# Patient Record
Sex: Female | Born: 1995 | Race: Black or African American | Hispanic: No | Marital: Single | Smoking: Never smoker
Health system: Southern US, Community
[De-identification: ages and names within clinical notes are randomized; demographics above are authoritative.]

## PROBLEM LIST (undated history)

## (undated) DIAGNOSIS — I890 Lymphedema, not elsewhere classified: Secondary | ICD-10-CM

## (undated) DIAGNOSIS — R569 Unspecified convulsions: Secondary | ICD-10-CM

---
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
## 2015-08-12 ENCOUNTER — Emergency Department: Payer: No Typology Code available for payment source

## 2015-08-12 ENCOUNTER — Encounter: Payer: Self-pay | Admitting: Emergency Medicine

## 2015-08-12 ENCOUNTER — Emergency Department
Admission: EM | Admit: 2015-08-12 | Discharge: 2015-08-12 | Disposition: A | Discharge status: Home / Self Care | Payer: No Typology Code available for payment source | Attending: Emergency Medicine | Admitting: Emergency Medicine

## 2015-08-12 DIAGNOSIS — Z79899 Other long term (current) drug therapy: Secondary | ICD-10-CM | POA: Diagnosis not present

## 2015-08-12 DIAGNOSIS — Z8669 Personal history of other diseases of the nervous system and sense organs: Secondary | ICD-10-CM | POA: Diagnosis not present

## 2015-08-12 DIAGNOSIS — R519 Headache, unspecified: Secondary | ICD-10-CM

## 2015-08-12 DIAGNOSIS — R51 Headache: Secondary | ICD-10-CM | POA: Insufficient documentation

## 2015-08-12 MED ORDER — BUTALBITAL-APAP-CAFFEINE 50-325-40 MG PO TABS: 1.0000 | ORAL_TABLET | Freq: Four times a day (QID) | ORAL | Status: AC | PRN
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
## 2015-08-26 ENCOUNTER — Emergency Department
Admission: EM | Admit: 2015-08-26 | Discharge: 2015-08-26 | Disposition: A | Discharge status: Home / Self Care | Payer: Self-pay | Attending: Emergency Medicine | Admitting: Emergency Medicine

## 2015-08-26 ENCOUNTER — Encounter: Payer: Self-pay | Admitting: Emergency Medicine

## 2015-08-26 DIAGNOSIS — R569 Unspecified convulsions: Secondary | ICD-10-CM | POA: Insufficient documentation

## 2015-08-26 LAB — POCT PREGNANCY, URINE: Preg Test, Ur: NEGATIVE
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
## 2015-08-26 NOTE — Discharge Instructions (Signed)
Seizure, Adult A seizure is abnormal electrical activity in the brain. Seizures usually last from 30 seconds to 2 minutes. There are various types of seizures. Before a seizure, you may have a warning sensation (aura) that a seizure is about to occur. An aura may include the following symptoms:   Fear or anxiety.  Nausea.  Feeling like the room is spinning (vertigo).  Vision changes, such as seeing flashing lights or spots. Common symptoms during a seizure include:  A change in attention or behavior (altered mental status).  Convulsions with rhythmic jerking movements.  Drooling.  Rapid eye movements.  Grunting.  Loss of bladder and bowel control.  Bitter taste in the mouth.  Tongue biting. After a seizure, you may feel confused and sleepy. You may also have an injury resulting from convulsions during the seizure. HOME CARE INSTRUCTIONS   If you are given medicines, take them exactly as prescribed by your health care provider.  Keep all follow-up appointments as directed by your health care provider.  Do not swim or drive or engage in risky activity during which a seizure could cause further injury to you or others until your health care provider says it is OK.  Get adequate rest.  Teach friends and family what to do if you have a seizure. They should:  Lay you on the ground to prevent a fall.  Put a cushion under your head.  Loosen any tight clothing around your neck.  Turn you on your side. If vomiting occurs, this helps keep your airway clear.  Stay with you until you recover.  Know whether or not you need emergency care. SEEK IMMEDIATE MEDICAL CARE IF:  The seizure lasts longer than 5 minutes.  The seizure is severe or you do not wake up immediately after the seizure.  You have an altered mental status after the seizure.  You are having more frequent or worsening seizures. Someone should drive you to the emergency department or call local emergency  services (911 in [place] MAKE SURE YOU:  Understand these instructions.  Will watch your condition.  Will get help right away if you are not doing well or get worse.   This information is not intended to replace advice given to you by your health care provider. Make sure you discuss any questions you have with your health care provider.   Document Released: 04/01/2000 Document Revised: 04/25/2014 Document Reviewed: 11/14/2012 Elsevier Interactive Patient Education 2016 [employer]  Epilepsy Epilepsy is a disorder in which a person has repeated seizures over time. A seizure is a release of abnormal electrical activity in the brain. Seizures can cause a change in attention, behavior, or the ability to remain awake and alert (altered mental status). Seizures often involve uncontrollable shaking (convulsions).  Most people with epilepsy lead normal lives. However, people with epilepsy are at an increased risk of falls, accidents, and injuries. Therefore, it is important to begin treatment right away. CAUSES  Epilepsy has many possible causes. Anything that disturbs the normal pattern of brain cell activity can lead to seizures. This may include:   Head injury.  Birth trauma.  High fever as a child.  Stroke.  Bleeding into or around the brain.  Certain drugs.  Prolonged low oxygen, such as what occurs after CPR efforts.  Abnormal brain development.  Certain illnesses, such as meningitis, encephalitis (brain infection), malaria, and other infections.  An imbalance of nerve signaling chemicals (neurotransmitters).  SIGNS AND SYMPTOMS  The symptoms of a seizure can  vary greatly from one person to another. Right before a seizure, you may have a warning (aura) that a seizure is about to occur. An aura may include the following symptoms:  Fear or anxiety.  Nausea.  Feeling like the room is spinning (vertigo).  Vision changes, such as seeing flashing lights or spots. Common  symptoms during a seizure include:  Abnormal sensations, such as an abnormal smell or a bitter taste in the mouth.   Sudden, general body stiffness.   Convulsions that involve rhythmic jerking of the face, arm, or leg on one or both sides.   Sudden change in consciousness.   Appearing to be awake but not responding.   Appearing to be asleep but cannot be awakened.   Grimacing, chewing, lip smacking, drooling, tongue biting, or loss of bowel or bladder control. After a seizure, you may feel sleepy for a while. DIAGNOSIS  Your health care provider will ask about your symptoms and take a medical history. Descriptions from any witnesses to your seizures will be very helpful in the diagnosis. A physical exam, including a detailed neurological exam, is necessary. Various tests may be done, such as:   An electroencephalogram (EEG). This is a painless test of your brain waves. In this test, a diagram is created of your brain waves. These diagrams can be interpreted by a specialist.  An MRI of the brain.   A CT scan of the brain.   A spinal tap (lumbar puncture, LP).  Blood tests to check for signs of infection or abnormal blood chemistry. TREATMENT  There is no cure for epilepsy, but it is generally treatable. Once epilepsy is diagnosed, it is important to begin treatment as soon as possible. For most people with epilepsy, seizures can be controlled with medicines. The following may also be used:  A pacemaker for the brain (vagus nerve stimulator) can be used for people with seizures that are not well controlled by medicine.  Surgery on the brain. For some people, epilepsy eventually goes away. HOME CARE INSTRUCTIONS   Follow your health care provider's recommendations on driving and safety in normal activities.  Get enough rest. Lack of sleep can cause seizures.  Only take over-the-counter or prescription medicines as directed by your health care provider. Take any  prescribed medicine exactly as directed.  Avoid any known triggers of your seizures.  Keep a seizure diary. Record what you recall about any seizure, especially any possible trigger.   Make sure the people you live and work with know that you are prone to seizures. They should receive instructions on how to help you. In general, a witness to a seizure should:   Cushion your head and body.   Turn you on your side.   Avoid unnecessarily restraining you.   Not place anything inside your mouth.   Call for emergency medical help if there is any question about what has occurred.   Follow up with your health care provider as directed. You may need regular blood tests to monitor the levels of your medicine.  SEEK MEDICAL CARE IF:   You develop signs of infection or other illness. This might increase the risk of a seizure.   You seem to be having more frequent seizures.   Your seizure pattern is changing.  SEEK IMMEDIATE MEDICAL CARE IF:   You have a seizure that does not stop after a few moments.   You have a seizure that causes any difficulty in breathing.   You have a  seizure that results in a very severe headache.   You have a seizure that leaves you with the inability to speak or use a part of your body.    This information is not intended to replace advice given to you by your health care provider. Make sure you discuss any questions you have with your health care provider.   Document Released: 04/04/2005 Document Revised: 01/23/2013 Document Reviewed: 11/14/2012 Elsevier Interactive Patient Education [employer] Elsevier Inc.

## 2015-08-26 NOTE — ED Notes (Signed)
Pt had witnessed seizure that lasted approx 45 sec per EMS. EMS picked pt up from laundry mat. Pt is A&O. Pt hx of seizures and denies any noncompliance. Pt states sometimes her meds are not at the same time though. Pt states her last seizure was in March. VS stable

## 2015-08-26 NOTE — ED Provider Notes (Signed)
[facility] Emergency Department Provider Note  ____________________________________________  Time seen: 6:30 PM  I have reviewed the triage vital signs and the nursing notes.   HISTORY  Chief Complaint Seizures    HPI Diana Richards is a 20 y.o. female comes to the ED due to a seizure today. This was a witnessed seizure in the [place]. The patient has a history of epilepsy, recent admission to [facility] in February or March for increased seizure frequency at that time. She did undergo MRI and EEG according to the electronic medical record although I'm unable to find the specific results of those procedures. Her twice a day Keppra was increased to 1500 mg twice a day. No other medications at this time other than Fioricet which she takes for chronic headaches. The headaches are unchanged. No new trauma or illness. She is otherwise in her usual state of health, no drug use, eating and drinking normally. She feels like she is sleeping well as well. Denies excessive stimulant use.     Past Medical History  Diagnosis Date  . Seizures (HCC)   . Lymphedema      There are no active problems to display for this patient.    History reviewed. No pertinent past surgical history.   Current Outpatient Rx  Name  Route  Sig  Dispense  Refill  . butalbital-acetaminophen-caffeine (FIORICET) 50-325-40 MG tablet   Oral   Take 1-2 tablets by mouth every 6 (six) hours as needed for headache.   20 tablet   0   . levETIRAcetam (KEPPRA) 500 MG tablet   Oral   Take 1,500 mg by mouth 2 (two) times daily.            Allergies Review of patient's allergies indicates no known allergies.   No family history on file.  Social History Social History  Substance Use Topics  . Smoking status: Never Smoker   . Smokeless tobacco: None  . Alcohol Use: No    Review of Systems  Constitutional:   No fever or chills.  Eyes:   No vision changes.  ENT:   No sore throat. No  rhinorrhea. Cardiovascular:   No chest pain. Respiratory:   No dyspnea or cough. Gastrointestinal:   Negative for abdominal pain, vomiting and diarrhea.  Genitourinary:   Negative for dysuria or difficulty urinating. Musculoskeletal:   Negative for focal pain or swelling Neurological:  Positive for chronic bilateral frontal headache 10-point ROS otherwise negative.  ____________________________________________   PHYSICAL EXAM:  VITAL SIGNS: ED Triage Vitals  Enc Vitals Group     BP 08/26/15 1724 141/77 mmHg     Pulse Rate 08/26/15 1724 99     Resp 08/26/15 1724 16     Temp 08/26/15 1724 98.5 F (36.9 C)     Temp Source 08/26/15 1724 Oral     SpO2 08/26/15 1724 99 %     Weight --      Height --      Head Cir --      Peak Flow --      Pain Score 08/26/15 1726 10     Pain Loc --      Pain Edu? --      Excl. in GC? --     Vital signs reviewed, nursing assessments reviewed.   Constitutional:   Alert and oriented. Well appearing and in no distress. Eyes:   No scleral icterus. No conjunctival pallor. PERRL. EOMI.  No nystagmus. ENT   Head:  Normocephalic and atraumatic.   Nose:   No congestion/rhinnorhea. No septal hematoma   Mouth/Throat:   MMM, no pharyngeal erythema. No peritonsillar mass. Abrasion to left lateral side of the tongue   Neck:   No stridor. No SubQ emphysema. No meningismus. No neck pain, full range of motion Hematological/Lymphatic/Immunilogical:   No cervical lymphadenopathy. Cardiovascular:   RRR. Symmetric bilateral radial and DP pulses.  No murmurs.  Respiratory:   Normal respiratory effort without tachypnea nor retractions. Breath sounds are clear and equal bilaterally. No wheezes/rales/rhonchi. Gastrointestinal:   Soft and nontender. Non distended. There is no CVA tenderness.  No rebound, rigidity, or guarding. Genitourinary:   deferred Musculoskeletal:   Nontender with normal range of motion in all extremities. No joint effusions.  No  lower extremity tenderness.  No edema. Neurologic:   Normal speech and language.  CN 2-10 normal. Motor grossly intact. No gross focal neurologic deficits are appreciated.  Skin:    Skin is warm, dry and intact. No rash noted.  No petechiae, purpura, or bullae.  ____________________________________________    LABS (pertinent positives/negatives) (all labs ordered are listed, but only abnormal results are displayed) Labs Reviewed  POCT PREGNANCY, URINE  CBG MONITORING, ED   ____________________________________________   EKG  Interpreted by me Sinus rhythm rate of 96, normal axis intervals QRS ST segments and T waves.  ____________________________________________    RADIOLOGY    ____________________________________________   PROCEDURES   ____________________________________________   INITIAL IMPRESSION / ASSESSMENT AND PLAN / ED COURSE  Pertinent labs & imaging results that were available during my care of the patient were reviewed by me and considered in my medical decision making (see chart for details).  Patient well appearing no acute distress. Had a brief seizure episode today which was witnessed, exam is consistent with likely recent seizure.Unable to pinpoint an exact cause of the patient states that she does take her Keppra at varying times of the day and so she may have just been subtherapeutic relatively. Encouraged her to be rigorous about taking her medicines at the same time every morning and night, follow closely with primary care and neurology for continued monitoring of her symptoms. Return precautions for any increased seizure frequency. During period of observation here in the emergency department, the patient has not had any recurrent symptoms and is asymptomatic. Normal vital signs.     ____________________________________________   FINAL CLINICAL IMPRESSION(S) / ED DIAGNOSES  Final diagnoses:  Seizure (HCC)       Portions of this note  were generated with dragon dictation software. Dictation errors may occur despite best attempts at proofreading.   Sharman CheekPhillip Keana Dueitt, MD 08/26/15 1944

## 2015-09-24 ENCOUNTER — Ambulatory Visit: Payer: Medicaid Other

## 2015-09-27 ENCOUNTER — Encounter: Payer: Self-pay | Admitting: Emergency Medicine

## 2015-09-27 ENCOUNTER — Emergency Department: Payer: Medicaid Other

## 2015-09-27 ENCOUNTER — Emergency Department
Admission: EM | Admit: 2015-09-27 | Discharge: 2015-09-28 | Disposition: A | Discharge status: Home / Self Care | Payer: Medicaid Other | Attending: Emergency Medicine | Admitting: Emergency Medicine

## 2015-09-27 DIAGNOSIS — G40909 Epilepsy, unspecified, not intractable, without status epilepticus: Secondary | ICD-10-CM | POA: Insufficient documentation

## 2015-09-27 DIAGNOSIS — R569 Unspecified convulsions: Secondary | ICD-10-CM | POA: Diagnosis present

## 2015-09-27 LAB — COMPREHENSIVE METABOLIC PANEL
ALBUMIN: 4.3 g/dL (ref 3.5–5.0)
ALK PHOS: 65 U/L (ref 38–126)
ALT: 11 U/L — ABNORMAL LOW (ref 14–54)
AST: 28 U/L (ref 15–41)
Anion gap: 13 (ref 5–15)
BILIRUBIN TOTAL: 0.3 mg/dL (ref 0.3–1.2)
BUN: 9 mg/dL (ref 6–20)
CALCIUM: 9.4 mg/dL (ref 8.9–10.3)
CO2: 18 mmol/L — ABNORMAL LOW (ref 22–32)
Chloride: 106 mmol/L (ref 101–111)
Creatinine, Ser: 0.81 mg/dL (ref 0.44–1.00)
GFR calc Af Amer: >60 mL/min (ref >60)
GFR calc non Af Amer: >60 mL/min (ref >60)
GLUCOSE: 96 mg/dL (ref 65–99)
Potassium: 3.7 mmol/L (ref 3.5–5.1)
Sodium: 137 mmol/L (ref 135–145)
TOTAL PROTEIN: 7.8 g/dL (ref 6.5–8.1)

## 2015-09-27 LAB — CBC
HEMATOCRIT: 41.3 % (ref 35.0–47.0)
Hemoglobin: 13.2 g/dL (ref 12.0–16.0)
MCH: 28.1 pg (ref 26.0–34.0)
MCHC: 32 g/dL (ref 32.0–36.0)
MCV: 87.8 fL (ref 80.0–100.0)
Platelets: 252 10*3/uL (ref 150–440)
RBC: 4.71 MIL/uL (ref 3.80–5.20)
RDW: 12.9 % (ref 11.5–14.5)
WBC: 9.2 10*3/uL (ref 3.6–11.0)

## 2015-09-27 MED ORDER — SODIUM CHLORIDE 0.9 % IV SOLN
500.0000 mg | Freq: Once | INTRAVENOUS | Status: AC
  Administered 2015-09-27: 500 mg via INTRAVENOUS
  Filled 2015-09-27: qty 5

## 2015-09-27 MED ORDER — LEVETIRACETAM 1000 MG PO TABS: 1000.0000 mg | ORAL_TABLET | Freq: Two times a day (BID) | ORAL | Status: AC

## 2015-09-27 NOTE — ED Notes (Signed)
Patient transported to CT 

## 2015-09-27 NOTE — Discharge Instructions (Signed)

## 2015-09-27 NOTE — ED Provider Notes (Signed)
[facility] Emergency Department Provider Note  ____________________________________________    I have reviewed the triage vital signs and the nursing notes.   HISTORY  Chief Complaint Seizures    HPI Diana Richards is a 20 y.o. female who presents after reported seizure. Per EMS patient was at [facility] and was found down. She was found to be confused and likely postictal. Patient has a history of seizures and apparently takes Keppra. She continues to be confused which I do think is postictal state. History is limited because of this. Review of records shows that she sees [facility] neurology and she is on 1500 g of Keppra twice a day.     Past Medical History  Diagnosis Date  . Seizures (HCC)   . Lymphedema     There are no active problems to display for this patient.   History reviewed. No pertinent past surgical history.  Current Outpatient Rx  Name  Route  Sig  Dispense  Refill  . butalbital-acetaminophen-caffeine (FIORICET) 50-325-40 MG tablet   Oral   Take 1-2 tablets by mouth every 6 (six) hours as needed for headache.   20 tablet   0   . levETIRAcetam (KEPPRA) 500 MG tablet   Oral   Take 1,500 mg by mouth 2 (two) times daily.           Allergies Review of patient's allergies indicates no known allergies.  History reviewed. No pertinent family history.  Social History Social History  Substance Use Topics  . Smoking status: Never Smoker   . Smokeless tobacco: None  . Alcohol Use: No    Review of Systems Performed after patient had improved  Constitutional: Negative for fever. Eyes: Negative for redness ENT: Negative for sore throat Cardiovascular: Negative for chest pain Respiratory: Negative for shortness of breath. Gastrointestinal: Negative for abdominal pain Genitourinary: Negative for dysuria. Musculoskeletal: Negative for back pain. Skin: Negative for rash. Neurological: Negative for focal weakness Psychiatric: no  anxiety    ____________________________________________   PHYSICAL EXAM:  VITAL SIGNS: ED Triage Vitals  Enc Vitals Group     BP 09/27/15 2115 127/61 mmHg     Pulse Rate 09/27/15 2115 110     Resp 09/27/15 2115 19     Temp 09/27/15 2115 98.6 F (37 C)     Temp Source 09/27/15 2115 Oral     SpO2 09/27/15 2115 98 %     Weight 09/27/15 2115 150 lb (68.04 kg)     Height 09/27/15 2115  (1.676 m)     Head Cir --      Peak Flow --      Pain Score --      Pain Loc --      Pain Edu? --      Excl. in GC? --      Constitutional: Slow to respond but in no acute distress Eyes: Conjunctivae are normal. No erythema or injection ENT   Head: Normocephalic and atraumatic.   Mouth/Throat: Mucous membranes are moist. Cardiovascular: Normal rate, regular rhythm. Normal and symmetric distal pulses are present in the upper extremities. Respiratory: Normal respiratory effort without tachypnea nor retractions. Breath sounds are clear and equal bilaterally.  Gastrointestinal: Soft and non-tender in all quadrants. No distention. There is no CVA tenderness. Genitourinary: deferred Musculoskeletal: Nontender with normal range of motion in all extremities. No lower extremity tenderness nor edema. Neurologic:  Normal speech and language. No gross focal neurologic deficits are appreciated. Skin:  Skin is warm,  dry and intact. No rash noted. Psychiatric: Mood and affect are normal. Patient exhibits appropriate insight and judgment.  ____________________________________________    LABS (pertinent positives/negatives)  Labs Reviewed  COMPREHENSIVE METABOLIC PANEL - Abnormal; Notable for the following:    CO2 18 (*)    ALT 11 (*)    All other components within normal limits  CBC    ____________________________________________   EKG  None  ____________________________________________    RADIOLOGY  CT head  unremarkable  ____________________________________________   PROCEDURES  Procedure(s) performed: none  Critical Care performed: none  ____________________________________________   INITIAL IMPRESSION / ASSESSMENT AND PLAN / ED COURSE  Pertinent labs & imaging results that were available during my care of the patient were reviewed by me and considered in my medical decision making (see chart for details).  Discussed with Dr[facility] Regino [facility] of [facility]. He recommends increasing Keppra to 2000 mg twice a day and he will discuss with the patient's primary neurologist for close follow-up. We will observe the patient in the ED and if no more seizure activity she will be discharged with above prescription.  ____________________________________________   FINAL CLINICAL IMPRESSION(S) / ED DIAGNOSES  Final diagnoses:  Seizure (HCC)          Jene Everyobert Vianny Schraeder, MD 09/27/15 2312

## 2015-09-27 NOTE — ED Notes (Signed)
Pharm called for med 

## 2015-09-27 NOTE — ED Notes (Signed)
Patient presents to Emergency Department via EMS with complaints of [facility] [employer] and fall (unwitnessed) from a standing position.  Friends found pt on floor.    Pt reports hx of seizures  Since MArch 2016, pt reports taking Keppra bid and reports compliant.    Lip swollen and small amount of blood at right temple

## 2015-10-06 ENCOUNTER — Ambulatory Visit: Payer: Medicaid Other

## 2015-10-13 ENCOUNTER — Ambulatory Visit: Payer: Medicaid Other

## 2016-02-19 IMAGING — MR MR HEAD WO/W CM
12 of 13 series · 40 of 48 positions shown · IV contrast (13ml Multihance)
Comparison: CT head 09/22/2014

CLINICAL DATA: Seizure

EXAM:
MRI HEAD WITHOUT AND WITH CONTRAST
TECHNIQUE: Multiplanar, multiecho pulse sequences of the brain and surrounding
structures were obtained without and with intravenous contrast.
CONTRAST:  13mL MULTIHANCE GADOBENATE DIMEGLUMINE 529 MG/ML IV SOLN

[Series 2: T1 · sagittal · 5.0mm · 0.45mm/px · 1 of 25 slices shown]
[im 1/25]
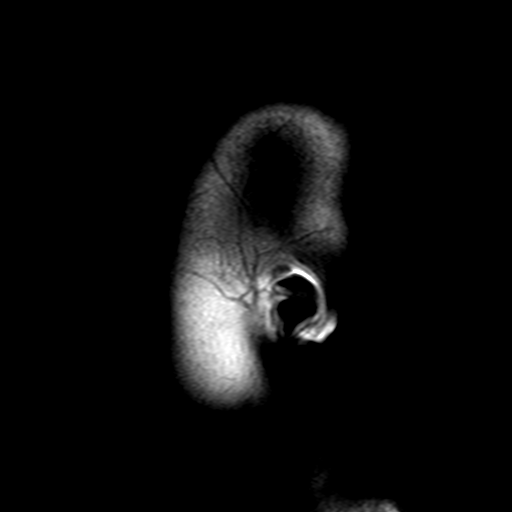

[Series 4: DWI · axial · 4.0mm · 0.94mm/px · z∈[-82,+81]mm · 4 of 43 slices shown (1 of 4)]
[im 1/43]
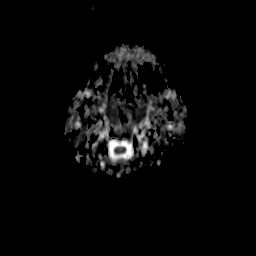
[im 15/43]
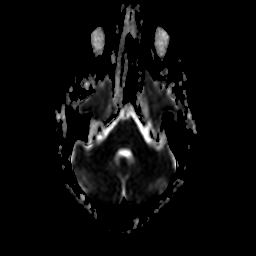
[im 29/43]
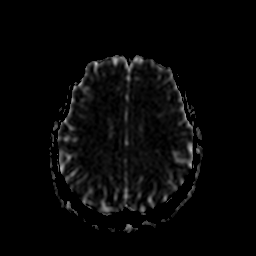
[im 43/43]
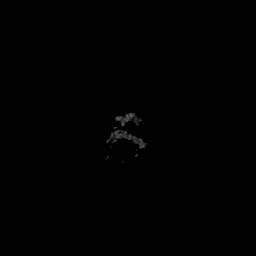

[Series 6: DWI · coronal · 5.0mm · 1.80mm/px · 4 of 37 slices shown (2 of 4)]
[im 1/37]
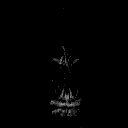
[im 13/37]
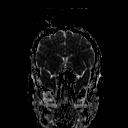
[im 25/37]
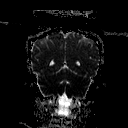
[im 37/37]
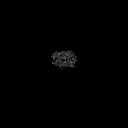

[Series 7: DWI · axial · 4.0mm · 0.94mm/px · z∈[-82,+89]mm · 8 of 87 slices shown (3 of 4)]
[im 1/87]
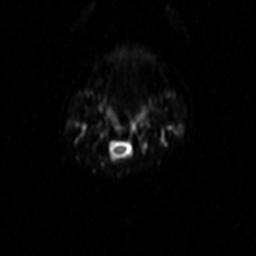
[im 13/87]
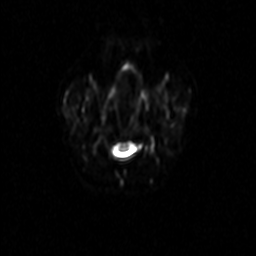
[im 25/87]
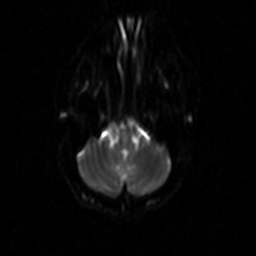
[im 37/87]
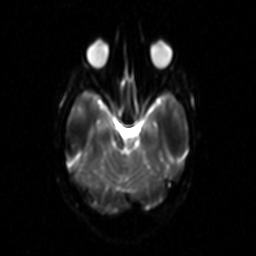
[im 50/87]
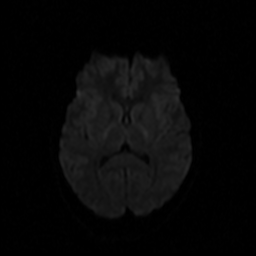
[im 62/87]
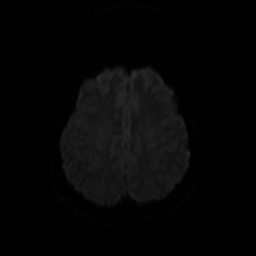
[im 74/87]
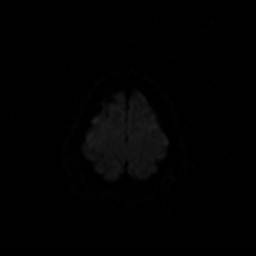
[im 87/87]
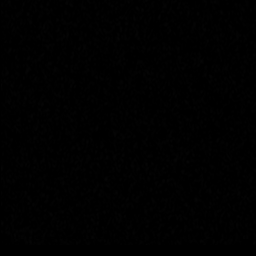

[Series 8: DWI · coronal · 5.0mm · 1.80mm/px · 3 of 36 slices shown (4 of 4)]
[im 1/36]
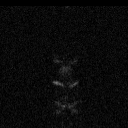
[im 18/36]
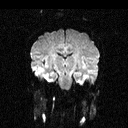
[im 36/36]
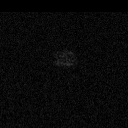

[Series 9: T2 · axial · 5.0mm · 0.45mm/px · z∈[-74,+84]mm · 2 of 26 slices shown (1 of 4)]
[im 1/26]
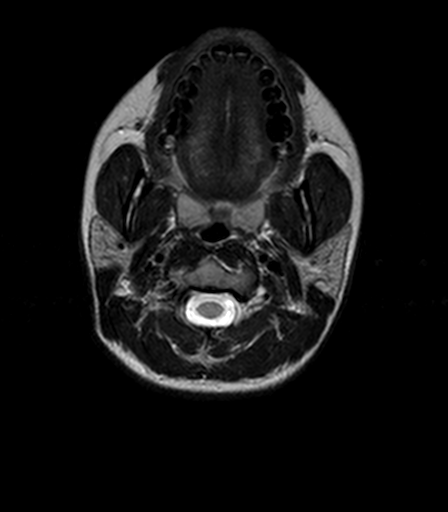
[im 26/26]
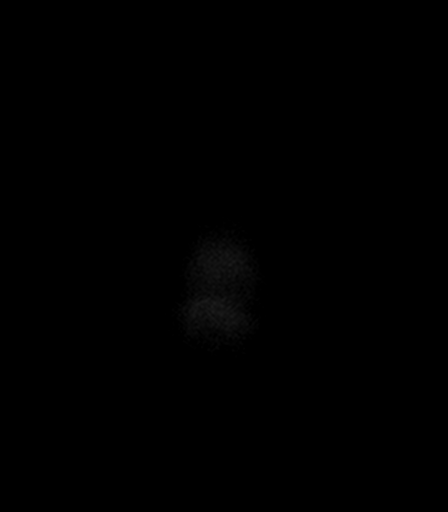

[Series 10: FLAIR · axial · 5.0mm · 0.90mm/px · z∈[-74,+84]mm · 2 of 26 slices shown]
[im 1/26]
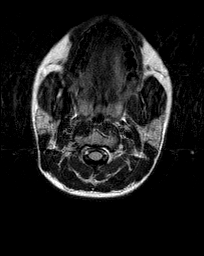
[im 26/26]
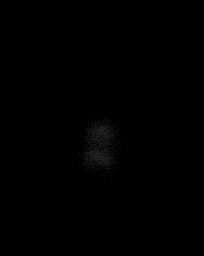

[Series 11: T2 · axial · 5.0mm · 0.45mm/px · z∈[-74,+84]mm · 2 of 26 slices shown (2 of 4)]
[im 1/26]
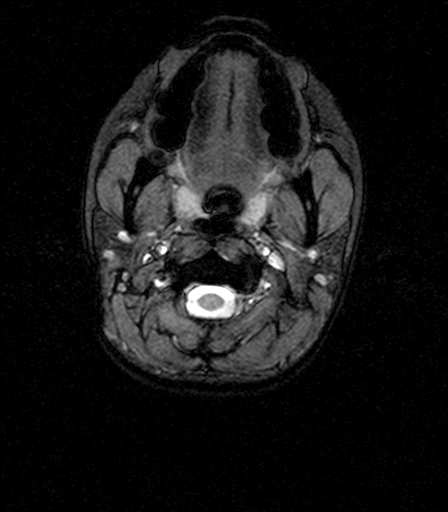
[im 26/26]
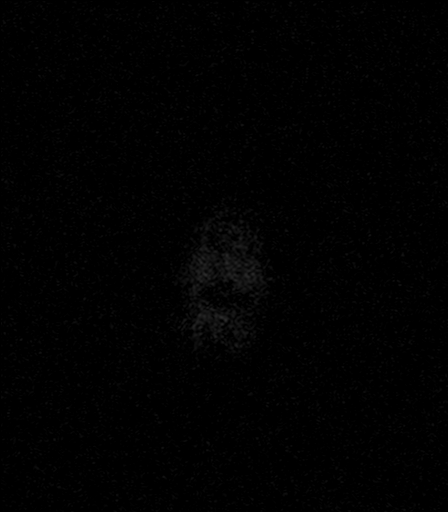

[Series 13: T2 · oblique · 3.0mm · 0.70mm/px · 2 of 21 slices shown (3 of 4)]
[im 1/21]
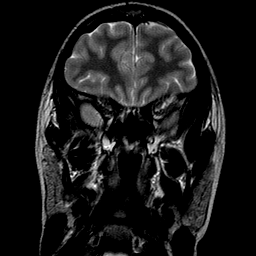
[im 21/21]
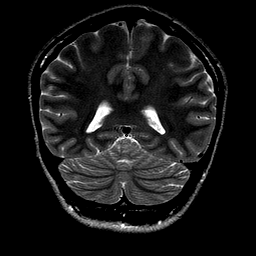

[Series 14: T2 · coronal · 5.0mm · 0.45mm/px · 3 of 29 slices shown (4 of 4)]
[im 1/29]
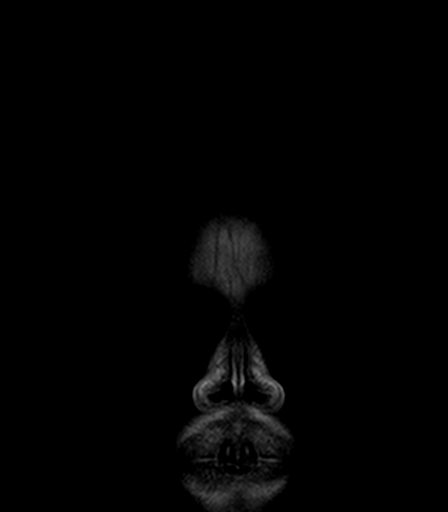
[im 15/29]
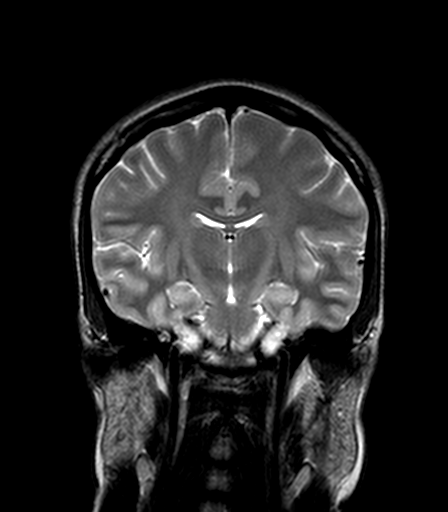
[im 29/29]
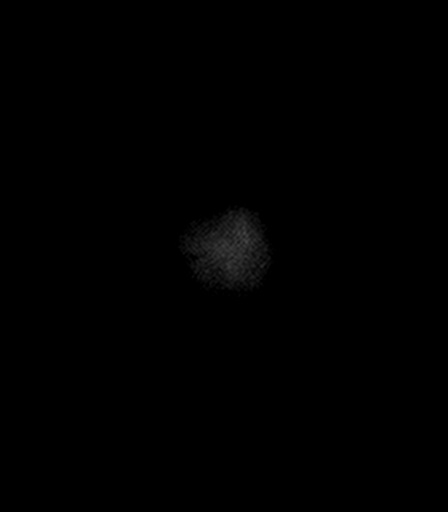

[Series 15: T1 post-contrast · axial · 3.0mm · 0.45mm/px · z∈[-81,+91]mm · 6 of 60 slices shown (1 of 2)]
[im 1/60]
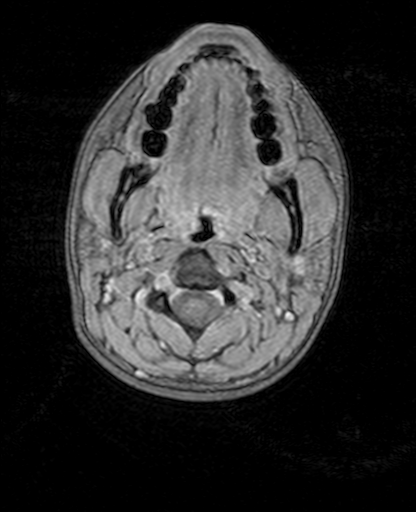
[im 12/60]
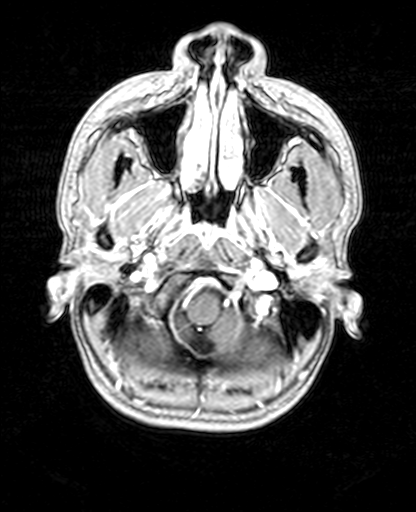
[im 24/60]
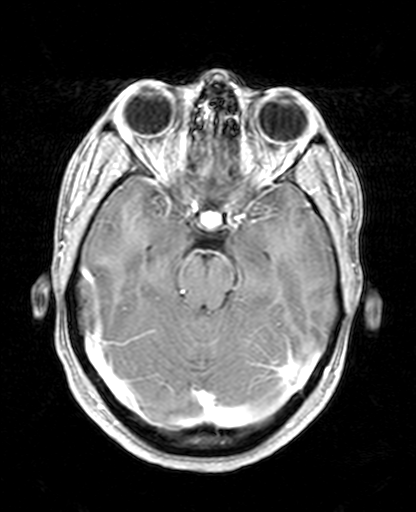
[im 36/60]
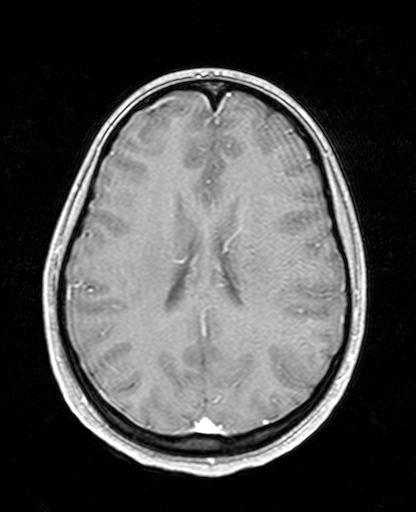
[im 48/60]
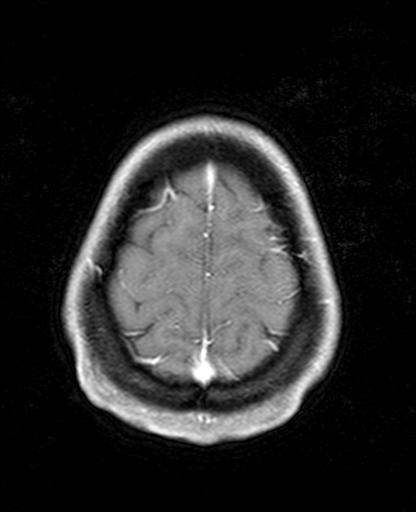
[im 60/60]
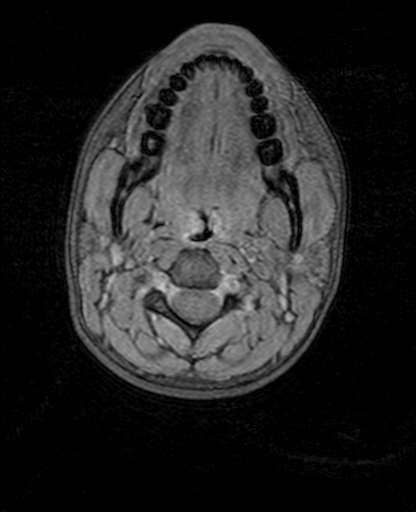

[Series 16: T1 post-contrast · coronal · 5.0mm · 0.45mm/px · 3 of 29 slices shown (2 of 2)]
[im 1/29]
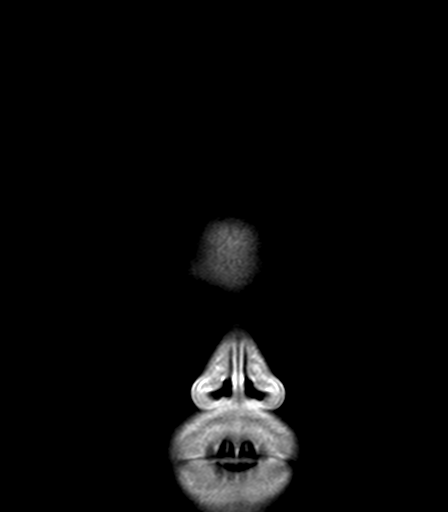
[im 15/29]
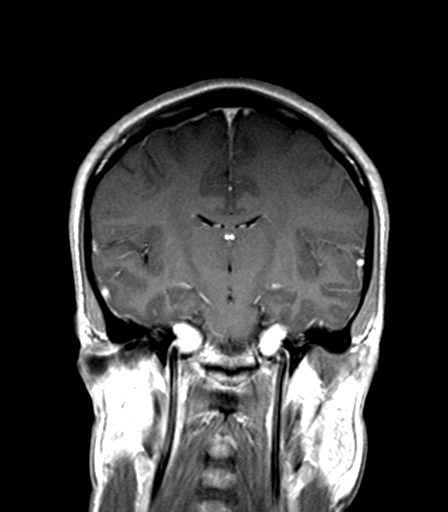
[im 29/29]
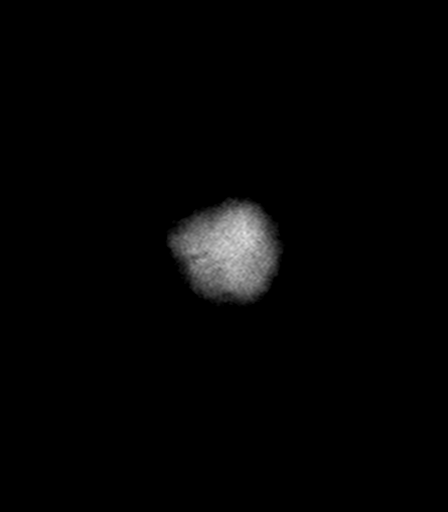

[40 of 48 positions shown; findings below may reference images not displayed]

FINDINGS: Ventricle size normal.  Cerebral volume normal.

Negative for acute or chronic infarct.

Negative for demyelinating disease. Cerebral white matter normal.
Basal ganglia and brainstem normal

Negative for hemorrhage or mass.

Negative for mesial temporal sclerosis. Medial temporal lobes normal
in signal and volume

Normal enhancement following contrast infusion. No enhancing mass
lesion.

Paranasal sinuses are clear.  Pituitary not enlarged.
IMPRESSION: Negative

## 2016-03-29 ENCOUNTER — Encounter: Payer: Self-pay | Admitting: Emergency Medicine

## 2016-03-29 ENCOUNTER — Emergency Department
Admission: EM | Admit: 2016-03-29 | Discharge: 2016-03-29 | Disposition: A | Discharge status: Home / Self Care | Payer: No Typology Code available for payment source | Attending: Emergency Medicine | Admitting: Emergency Medicine

## 2016-03-29 DIAGNOSIS — S3992XA Unspecified injury of lower back, initial encounter: Secondary | ICD-10-CM | POA: Diagnosis present

## 2016-03-29 DIAGNOSIS — Y999 Unspecified external cause status: Secondary | ICD-10-CM | POA: Insufficient documentation

## 2016-03-29 DIAGNOSIS — S39012A Strain of muscle, fascia and tendon of lower back, initial encounter: Secondary | ICD-10-CM | POA: Diagnosis not present

## 2016-03-29 DIAGNOSIS — Y939 Activity, unspecified: Secondary | ICD-10-CM | POA: Insufficient documentation

## 2016-03-29 DIAGNOSIS — Y92481 Parking lot as the place of occurrence of the external cause: Secondary | ICD-10-CM | POA: Insufficient documentation

## 2016-03-29 MED ORDER — MELOXICAM 15 MG PO TABS: 15.0000 mg | ORAL_TABLET | Freq: Every day | ORAL | 0 refills | Status: AC

## 2016-03-29 MED ORDER — CYCLOBENZAPRINE HCL 10 MG PO TABS: 10.0000 mg | ORAL_TABLET | Freq: Three times a day (TID) | ORAL | 0 refills | Status: AC | PRN
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
## 2017-04-25 ENCOUNTER — Emergency Department
Admission: EM | Admit: 2017-04-25 | Discharge: 2017-04-25 | Disposition: A | Discharge status: Home / Self Care | Payer: Self-pay | Attending: Emergency Medicine | Admitting: Emergency Medicine

## 2017-04-25 ENCOUNTER — Encounter: Payer: Self-pay | Admitting: Emergency Medicine

## 2017-04-25 DIAGNOSIS — R569 Unspecified convulsions: Secondary | ICD-10-CM | POA: Insufficient documentation

## 2017-04-25 LAB — CBC WITH DIFFERENTIAL/PLATELET
Basophils Absolute: 0.1 10*3/uL (ref 0–0.1)
Basophils Relative: 1 %
EOS ABS: 0.4 10*3/uL (ref 0–0.7)
EOS PCT: 6 %
HCT: 40.1 % (ref 35.0–47.0)
Hemoglobin: 13 g/dL (ref 12.0–16.0)
LYMPHS ABS: 2 10*3/uL (ref 1.0–3.6)
Lymphocytes Relative: 30 %
MCH: 28.6 pg (ref 26.0–34.0)
MCHC: 32.3 g/dL (ref 32.0–36.0)
MCV: 88.5 fL (ref 80.0–100.0)
Monocytes Absolute: 0.6 10*3/uL (ref 0.2–0.9)
Monocytes Relative: 9 %
Neutro Abs: 3.6 10*3/uL (ref 1.4–6.5)
Neutrophils Relative %: 54 %
Platelets: 236 10*3/uL (ref 150–440)
RBC: 4.53 MIL/uL (ref 3.80–5.20)
RDW: 12.5 % (ref 11.5–14.5)
WBC: 6.6 10*3/uL (ref 3.6–11.0)

## 2017-04-25 LAB — COMPREHENSIVE METABOLIC PANEL
ALK PHOS: 50 U/L (ref 38–126)
ALT: 6 U/L — ABNORMAL LOW (ref 14–54)
ANION GAP: 11 (ref 5–15)
AST: 33 U/L (ref 15–41)
Albumin: 4.1 g/dL (ref 3.5–5.0)
BUN: 14 mg/dL (ref 6–20)
CALCIUM: 9.2 mg/dL (ref 8.9–10.3)
CHLORIDE: 106 mmol/L (ref 101–111)
CO2: 20 mmol/L — AB (ref 22–32)
Creatinine, Ser: 0.86 mg/dL (ref 0.44–1.00)
GFR calc non Af Amer: >60 mL/min (ref >60)
Glucose, Bld: 125 mg/dL — ABNORMAL HIGH (ref 65–99)
Potassium: 4.2 mmol/L (ref 3.5–5.1)
SODIUM: 137 mmol/L (ref 135–145)
Total Bilirubin: 0.3 mg/dL (ref 0.3–1.2)
Total Protein: 7.8 g/dL (ref 6.5–8.1)

## 2017-04-25 LAB — URINALYSIS, COMPLETE (UACMP) WITH MICROSCOPIC
BACTERIA UA: NONE SEEN
Bilirubin Urine: NEGATIVE
GLUCOSE, UA: NEGATIVE mg/dL
Ketones, ur: 5 mg/dL — AB
NITRITE: NEGATIVE
PH: 6 (ref 5.0–8.0)
PROTEIN: 30 mg/dL — AB
Specific Gravity, Urine: 1.017 (ref 1.005–1.030)

## 2017-04-25 LAB — POCT PREGNANCY, URINE: Preg Test, Ur: NEGATIVE

## 2017-04-25 MED ORDER — LAMOTRIGINE 100 MG PO TABS
100.0000 mg | ORAL_TABLET | Freq: Once | ORAL | Status: AC
  Administered 2017-04-25: 100 mg via ORAL
  Filled 2017-04-25: qty 1

## 2017-04-25 MED ORDER — LEVETIRACETAM 500 MG PO TABS
1500.0000 mg | ORAL_TABLET | Freq: Once | ORAL | Status: AC
  Administered 2017-04-25: 1500 mg via ORAL
  Filled 2017-04-25: qty 3

## 2017-04-25 MED ORDER — IBUPROFEN 800 MG PO TABS
800.0000 mg | ORAL_TABLET | Freq: Once | ORAL | Status: AC
  Administered 2017-04-25: 800 mg via ORAL
  Filled 2017-04-25: qty 1

## 2017-04-25 MED ORDER — LEVETIRACETAM 750 MG PO TABS: 1500.0000 mg | ORAL_TABLET | Freq: Two times a day (BID) | ORAL | 3 refills | Status: AC

## 2017-04-25 MED ORDER — LAMOTRIGINE 100 MG PO TABS: 100.0000 mg | ORAL_TABLET | Freq: Every day | ORAL | 11 refills | Status: AC

## 2017-04-25 NOTE — Care Management Note (Signed)
Case Management Note  Patient Details  Name: Diana Richards MRN: [id number] Date of Birth: 03/28/1996  Subjective/Objective:      Spoke to patient at bedside. Asked for permission to speak in front of her visitors, and she says it's ok. I enquired about whether or not she had a problem getting her medication, and she says she has not had them for some time. When asked about [facility] she says she no longer qualified for the program but knows it well.  I have given her a new application [facility] as well as the [facility] across the street. She has no questions at this time, and I have let the RN and MD for the patient know I have given her the forms for both.              Action/Plan:   Expected Discharge Date:                  Expected Discharge Plan:     In-House Referral:     Discharge planning Services     Post Acute Care Choice:    Choice offered to:     DME Arranged:    DME Agency:     HH Arranged:    HH Agency:     Status of Service:     If discussed at [employer] Length of Stay Meetings, dates discussed:    Additional Comments:  [name], RN 04/25/2017, 12:47 PM

## 2017-04-25 NOTE — ED Notes (Signed)
Seizure pads in place
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
## 2019-04-24 ENCOUNTER — Emergency Department
Admission: EM | Admit: 2019-04-24 | Discharge: 2019-04-25 | Disposition: A | Discharge status: Home / Self Care | Payer: Self-pay | Attending: Student | Admitting: Student

## 2019-04-24 ENCOUNTER — Encounter: Payer: Self-pay | Admitting: Emergency Medicine

## 2019-04-24 ENCOUNTER — Other Ambulatory Visit: Payer: Self-pay

## 2019-04-24 DIAGNOSIS — L02213 Cutaneous abscess of chest wall: Secondary | ICD-10-CM | POA: Insufficient documentation

## 2019-04-24 DIAGNOSIS — L0291 Cutaneous abscess, unspecified: Secondary | ICD-10-CM

## 2019-04-24 DIAGNOSIS — I890 Lymphedema, not elsewhere classified: Secondary | ICD-10-CM | POA: Insufficient documentation

## 2019-04-24 MED ORDER — LIDOCAINE HCL (PF) 1 % IJ SOLN
5.0000 mL | Freq: Once | INTRAMUSCULAR | Status: AC
  Administered 2019-04-25: 5 mL
  Filled 2019-04-24: qty 5

## 2019-04-24 MED ORDER — SULFAMETHOXAZOLE-TRIMETHOPRIM 800-160 MG PO TABS
1.0000 | ORAL_TABLET | Freq: Once | ORAL | Status: AC
  Administered 2019-04-24: 23:00:00 1 via ORAL
  Filled 2019-04-24: qty 1

## 2019-04-24 MED ORDER — SULFAMETHOXAZOLE-TRIMETHOPRIM 800-160 MG PO TABS: 1.0000 | ORAL_TABLET | Freq: Two times a day (BID) | ORAL | 0 refills | Status: AC

## 2019-04-24 MED ORDER — HYDROCODONE-ACETAMINOPHEN 5-325 MG PO TABS
1.0000 | ORAL_TABLET | Freq: Once | ORAL | Status: AC
  Administered 2019-04-24: 1 via ORAL
  Filled 2019-04-24: qty 1

## 2019-04-24 MED ORDER — TRAMADOL HCL 50 MG PO TABS: 50.0000 mg | ORAL_TABLET | Freq: Three times a day (TID) | ORAL | 0 refills | Status: AC | PRN

## 2019-04-24 NOTE — Discharge Instructions (Signed)
Keep the area clean, dry, and covered. Take the antibiotic as directed and the pain medicine as needed. Return to the ED in 3 days for wound check and packing removal.

## 2019-04-24 NOTE — ED Triage Notes (Signed)
Pt presents to ED with a knot or possible abscess in between her breasts since Sunday. Tender with palpation and movement. Denies drainage.

## 2019-04-24 NOTE — ED Notes (Signed)
Pt called to triage with no reply. Pt not found in lobby.

## 2019-04-24 NOTE — ED Provider Notes (Signed)
[facility] Emergency Department Provider Note ____________________________________________  Time seen: 2310  I have reviewed the triage vital signs and the nursing notes.  HISTORY  Chief Complaint  Abscess   HPI Diana Richards is a 24 y.o. female presents her self to the ED for evaluation of a central abscess to the chest.  Patient describes her last  4 days, she has developed an area of tenderness and fluctuance to the cleavage region she denies any fevers, chills, sweats but she does admit to previous skin abscesses but, reports they have spontaneously resolved.  Past Medical History:  Diagnosis Date  . Lymphedema   . Seizures (HCC)     There are no problems to display for this patient.   History reviewed. No pertinent surgical history.  Prior to Admission medications   Medication Sig Start Date End Date Taking? Authorizing Provider  butalbital-acetaminophen-caffeine (FIORICET) 50-325-40 MG tablet Take 1-2 tablets by mouth every 6 (six) hours as needed for headache. 08/12/15   Beers, Charmayne Sheer, PA-C  cyclobenzaprine (FLEXERIL) 10 MG tablet Take 1 tablet (10 mg total) by mouth 3 (three) times daily as needed for muscle spasms. 03/29/16   Cuthriell, Delorise Royals, PA-C  lamoTRIgine (LAMICTAL) 100 MG tablet Take 1 tablet (100 mg total) by mouth daily. 04/25/17 04/25/18  Emily Filbert, MD  levETIRAcetam (KEPPRA) 1000 MG tablet Take 1 tablet (1,000 mg total) by mouth 2 (two) times daily. 09/27/15   Jene Every, MD  levETIRAcetam (KEPPRA) 750 MG tablet Take 2 tablets (1,500 mg total) by mouth 2 (two) times daily. 04/25/17   Emily Filbert, MD  meloxicam (MOBIC) 15 MG tablet Take 1 tablet (15 mg total) by mouth daily. 03/29/16   Cuthriell, Delorise Royals, PA-C  sulfamethoxazole-trimethoprim (BACTRIM DS) 800-160 MG tablet Take 1 tablet by mouth 2 (two) times daily. 04/24/19   Ravis Herne, Charlesetta Ivory, PA-C  traMADol (ULTRAM) 50 MG tablet Take 1 tablet (50 mg total)  by mouth 3 (three) times daily as needed for up to 3 days. 04/24/19 04/27/19  Kamariyah Timberlake, Charlesetta Ivory, PA-C    Allergies Patient has no known allergies.  History reviewed. No pertinent family history.  Social History Social History   Tobacco Use  . Smoking status: Never Smoker  . Smokeless tobacco: Never Used  Substance Use Topics  . Alcohol use: No  . Drug use: No    Review of Systems  Constitutional: Negative for fever. Cardiovascular: Negative for chest pain. Respiratory: Negative for shortness of breath. Musculoskeletal: Negative for back pain. Skin: Negative for rash.  Skin abscess as above. Neurological: Negative for headaches, focal weakness or numbness. ____________________________________________  PHYSICAL EXAM:  VITAL SIGNS: ED Triage Vitals  Enc Vitals Group     BP 04/24/19 2235 140/80     Pulse Rate 04/24/19 2235 61     Resp 04/24/19 2235 18     Temp 04/24/19 2235 98.6 F (37 C)     Temp Source 04/24/19 2235 Oral     SpO2 04/24/19 2235 100 %     Weight 04/24/19 2233 148 lb (67.1 kg)     Height 04/24/19 2233 5\' 6"  (1.676 m)     Head Circumference --      Peak Flow --      Pain Score 04/24/19 2233 7     Pain Loc --      Pain Edu? --      Excl. in GC? --     Constitutional: Alert and oriented.  Well appearing and in no distress. Head: Normocephalic and atraumatic. Eyes: Conjunctivae are normal. Normal extraocular movements Neck: Supple. No thyromegaly. Hematological/Lymphatic/Immunological: No cervical lymphadenopathy. Cardiovascular: Normal rate, regular rhythm. Normal distal pulses. Respiratory: Normal respiratory effort. No wheezes/rales/rhonchi. Musculoskeletal: Nontender with normal range of motion in all extremities.  Neurologic:  Normal gait without ataxia. Normal speech and language. No gross focal neurologic deficits are appreciated. Skin:  Skin is warm, dry and intact. No rash noted.  Patient with a 2 cm elongated abscess to the central chest  between the breast.  The area is fluctuant and tender to palpation.  No spontaneous drainage is noted. ____________________________________________  PROCEDURES  Norco 5-325 mg p.o. Bactrim DS 1 p.o.  Marland Kitchen.Incision and Drainage  Date/Time: 04/24/2019 11:18 PM Performed by: Melvenia Needles, PA-C Authorized by: Melvenia Needles, PA-C   Consent:    Consent obtained:  Verbal   Consent given by:  Patient   Risks discussed:  Bleeding, pain and incomplete drainage   Alternatives discussed:  Alternative treatment Location:    Type:  Abscess   Location:  Trunk   Trunk location:  Chest Pre-procedure details:    Skin preparation:  Betadine Procedure type:    Complexity:  Simple Procedure details:    Incision types:  Single straight   Incision depth:  Subcutaneous   Scalpel blade:  11   Wound management:  Probed and deloculated and irrigated with saline   Drainage:  Purulent   Drainage amount:  Moderate   Packing materials:  1/4 in iodoform gauze Post-procedure details:    Patient tolerance of procedure:  Tolerated well, no immediate complications   ____________________________________________  INITIAL IMPRESSION / ASSESSMENT AND PLAN / ED COURSE  Patient with ED evaluation of a central chest abscess.  She presents for I&D procedure.  The I&D was performed with meaningful drainage of a chest wall abscess.  Patient has the wound packed, and is discharged with wound care instructions and supplies.  She will return to the ED in 3 days for packing removal.  Bactrim is provided for antibiotic benefit.  She will return to the ED as discussed.  KATHARYN SCHAUER was evaluated in Emergency Department on 04/24/2019 for the symptoms described in the history of present illness. She was evaluated in the context of the global COVID-19 pandemic, which necessitated consideration that the patient might be at risk for infection with the SARS-CoV-2 virus that causes COVID-19. Institutional  protocols and algorithms that pertain to the evaluation of patients at risk for COVID-19 are in a state of rapid change based on information released by regulatory bodies including the CDC and federal and state organizations. These policies and algorithms were followed during the patient's care in the ED.  I reviewed the patient's prescription history over the last 12 months in the multi-state controlled substances database(s) that includes [place], [place], [place], [place], [place], [place], [place], [place], [place], [place], [place], [place], [place], and [place].  Results were notable for no RX history. ____________________________________________  FINAL CLINICAL IMPRESSION(S) / ED DIAGNOSES  Final diagnoses:  Abscess      Carmie End, Dannielle Karvonen, PA-C 04/24/19 2357    Lilia Pro., MD 04/27/19 1043

## 2019-04-24 NOTE — ED Notes (Signed)
Pt to STAT desk and asking when she will be seen. Pt told she had been taken off the board since we were unable to find her. Pt placed back in waiting room in Epic.

## 2019-04-24 NOTE — ED Notes (Signed)
Pt called from lobby to be taken to triage. Unable to locate pt.

## 2019-04-24 NOTE — ED Notes (Signed)
Pt called from lobby with no reply. Unable to locate pt.  

## 2021-03-09 ENCOUNTER — Ambulatory Visit: Payer: Self-pay

## 2021-03-19 ENCOUNTER — Ambulatory Visit: Payer: Self-pay | Admitting: Family Medicine

## 2021-03-19 ENCOUNTER — Other Ambulatory Visit: Payer: Self-pay

## 2021-03-19 ENCOUNTER — Encounter: Payer: Self-pay | Admitting: Family Medicine

## 2021-03-19 ENCOUNTER — Ambulatory Visit (LOCAL_COMMUNITY_HEALTH_CENTER): Payer: Self-pay | Admitting: Family Medicine

## 2021-03-19 VITALS — BP 125/80 | Ht 66.75 in | Wt 142.6 lb

## 2021-03-19 DIAGNOSIS — Z01419 Encounter for gynecological examination (general) (routine) without abnormal findings: Secondary | ICD-10-CM

## 2021-03-19 DIAGNOSIS — Z3046 Encounter for surveillance of implantable subdermal contraceptive: Secondary | ICD-10-CM

## 2021-03-19 DIAGNOSIS — Z1272 Encounter for screening for malignant neoplasm of vagina: Secondary | ICD-10-CM

## 2021-03-19 DIAGNOSIS — Z113 Encounter for screening for infections with a predominantly sexual mode of transmission: Secondary | ICD-10-CM

## 2021-03-19 DIAGNOSIS — Z3009 Encounter for other general counseling and advice on contraception: Secondary | ICD-10-CM

## 2021-03-19 LAB — WET PREP FOR TRICH, YEAST, CLUE
Trichomonas Exam: NEGATIVE
Yeast Exam: NEGATIVE

## 2021-03-19 LAB — HM HIV SCREENING LAB: HM HIV Screening: NEGATIVE

## 2021-03-19 NOTE — Progress Notes (Signed)
Patient here for a PE and STD check. Providers orders completed. Wet prep reviewed. Condoms declined.

## 2021-03-19 NOTE — Progress Notes (Signed)
[facility] DEPARTMENT [facility] [address] Main Number: [phone]    Family Planning Visit- Initial Visit  Subjective:  Diana Richards is a 25 y.o.  [id number]   being seen today for an initial annual visit and to discuss reproductive life planning.  The patient is currently using Hormonal Implant for pregnancy prevention. Patient reports   does not want a pregnancy in the next year.  Patient has the following medical conditions does not have a problem list on file.  Chief Complaint  Patient presents with   Annual Exam   Contraception    Patient reports here for physical and nexplanon removal  Patient denies any other concerns.    Body mass index is 22.5 kg/m. - Patient is eligible for diabetes screening based on BMI and age >43?  no HA1C ordered? no  Patient reports 1  partner/s in last year. Desires STI screening?  Yes  Has patient been screened once for HCV in the past?  No  No results found for: HCVAB  Does the patient have current drug use (including MJ), have a partner with drug use, and/or has been incarcerated since last result? No  If yes-- Screen for HCV through [facility]   Does the patient meet criteria for HBV testing? No  Criteria:  -Household, sexual or needle sharing contact with HBV -History of drug use -HIV positive -Those with known Hep C   Health Maintenance Due  Topic Date Due   HPV VACCINES (1 - 2-dose series) Never done   HIV Screening  Never done   Hepatitis C Screening  Never done   TETANUS/TDAP  Never done   PAP-Cervical Cytology Screening  Never done   PAP SMEAR-Modifier  Never done   INFLUENZA VACCINE  Never done    Review of Systems  Constitutional:  Negative for chills, fever, malaise/fatigue and weight loss.  HENT:  Negative for congestion, hearing loss and sore throat.   Eyes:  Negative for blurred vision, double vision and photophobia.  Respiratory:  Negative for shortness of breath.    Cardiovascular:  Negative for chest pain.  Gastrointestinal:  Negative for abdominal pain, blood in stool, constipation, diarrhea, heartburn, nausea and vomiting.  Genitourinary:  Negative for dysuria and frequency.  Musculoskeletal:  Negative for back pain, joint pain and neck pain.  Skin:  Negative for itching and rash.  Neurological:  Negative for dizziness, weakness and headaches.  Endo/Heme/Allergies:  Does not bruise/bleed easily.  Psychiatric/Behavioral:  Negative for depression, substance abuse and suicidal ideas.    The following portions of the patient's history were reviewed and updated as appropriate: allergies, current medications, past family history, past medical history, past social history, past surgical history and problem list. Problem list updated.   See flowsheet for other program required questions.  Objective:   Vitals:   03/19/21 1424  BP: 125/80  Weight: 142 lb 9.6 oz (64.7 kg)  Height: 5' 6.75" (1.695 m)    Physical Exam Vitals and nursing note reviewed.  Constitutional:      Appearance: Normal appearance.  HENT:     Head: Normocephalic and atraumatic.     Mouth/Throat:     Mouth: Mucous membranes are moist.     Dentition: Normal dentition. No dental caries.     Pharynx: No oropharyngeal exudate or posterior oropharyngeal erythema.     Comments: Piercings present  Eyes:     General: No scleral icterus. Cardiovascular:     Rate and  Rhythm: Normal rate.     Pulses: Normal pulses.  Pulmonary:     Effort: Pulmonary effort is normal.  Chest:     Comments: Breasts:        Right: Normal. No swelling, mass, nipple discharge, skin change or tenderness.        Left: Normal. No swelling, mass, nipple discharge, skin change or tenderness.   Abdominal:     General: Abdomen is flat. Bowel sounds are normal.     Palpations: Abdomen is soft.  Musculoskeletal:        General: Normal range of motion.     Right lower leg: Normal.     Left lower leg:  Swelling present. No edema.     Comments: Lymphoedema of LLE  Skin:    General: Skin is warm and dry.  Neurological:     General: No focal deficit present.     Mental Status: She is alert.      Assessment and Plan:  [name] is a 25 y.o. female presenting to the [facility] Department for an initial annual wellness/contraceptive visit  Contraception counseling: Reviewed all forms of birth control options in the tiered based approach. available including abstinence; over the counter/barrier methods; hormonal contraceptive medication including pill, patch, ring, injection,contraceptive implant, ECP; hormonal and nonhormonal IUDs; permanent sterilization options including vasectomy and the various tubal sterilization modalities. Risks, benefits, and typical effectiveness rates were reviewed.  Questions were answered.  Written information was also given to the patient to review.  Patient desires No Method - No Contraceptive Precautions, this was prescribed for patient.    The patient will follow up in  as needed  for surveillance.  The patient was told to call with any further questions, or with any concerns about this method of contraception.  Emphasized use of condoms 100% of the time for STI prevention.  Patient was offered ECP based on removal of BCM. Marland Kitchen ECP was not accepted by the patient. ECP counseling was not given - see RN documentation  1. Smear, vaginal, as part of routine gynecological examination Well woman exam  Pap today  CBE - Pap IG (Image Guided)  2. Screening examination for venereal disease Pt reports odor, pt is on menses. Discussed to RTC if odor continues after completion of menses.  - Chlamydia/Gonorrhea [place] [facility] - Gonococcus culture - WET PREP FOR TRICH, YEAST, CLUE - Syphilis Serology, [place] [facility] - HIV [place] [facility]  3. Nexplanon removal Nexplanon Removal Patient identified, informed consent performed, consent signed.   Appropriate time  out taken. Nexplanon site identified.  Area prepped in usual sterile fashon. 3 ml of 1% lidocaine with Epinephrine was used to anesthetize the area at the distal end of the implant and along implant site. A small stab incision was made right beside the implant on the distal portion.  The Nexplanon rod was grasped using hemostats and removed without difficulty.  There was minimal blood loss. There were no complications.  Steri-strips were applied over the small incision.  A pressure bandage was applied to reduce any bruising.  The patient tolerated the procedure well and was given post procedure instructions.   Counseled patient to take OTC analgesic starting as soon as lidocaine starts to wear off and take regularly for at least 48 hr to decrease discomfort.  Specifically to take with food or milk to decrease stomach upset and for IB 600 mg (3 tablets) every 6 hrs; IB 800 mg (4 tablets) every  8 hrs; or Aleve 2 tablets every 12 hrs.      Return for annual well woman exam, as needed.  No future appointments.  Wendi Snipes, FNP

## 2021-03-19 NOTE — Progress Notes (Signed)
See other note with same date.   Wendi Snipes, FNP

## 2021-03-24 LAB — GONOCOCCUS CULTURE

## 2021-03-26 LAB — PAP IG (IMAGE GUIDED): PAP Smear Comment: 0

## 2021-10-27 ENCOUNTER — Encounter: Payer: Self-pay | Admitting: Emergency Medicine

## 2021-10-27 DIAGNOSIS — G8929 Other chronic pain: Secondary | ICD-10-CM | POA: Insufficient documentation

## 2021-10-27 DIAGNOSIS — I890 Lymphedema, not elsewhere classified: Secondary | ICD-10-CM | POA: Insufficient documentation

## 2021-10-27 LAB — CBC WITH DIFFERENTIAL/PLATELET
Abs Immature Granulocytes: 0.02 10*3/uL (ref 0.00–0.07)
Basophils Absolute: 0.1 10*3/uL (ref 0.0–0.1)
Basophils Relative: 2 %
Eosinophils Absolute: 0.2 10*3/uL (ref 0.0–0.5)
Eosinophils Relative: 4 %
HCT: 42.5 % (ref 36.0–46.0)
Hemoglobin: 13.6 g/dL (ref 12.0–15.0)
Immature Granulocytes: 0 %
Lymphocytes Relative: 32 %
Lymphs Abs: 2 10*3/uL (ref 0.7–4.0)
MCH: 29.4 pg (ref 26.0–34.0)
MCHC: 32 g/dL (ref 30.0–36.0)
MCV: 91.8 fL (ref 80.0–100.0)
Monocytes Absolute: 0.6 10*3/uL (ref 0.1–1.0)
Monocytes Relative: 9 %
Neutro Abs: 3.3 10*3/uL (ref 1.7–7.7)
Neutrophils Relative %: 53 %
Platelets: 278 10*3/uL (ref 150–400)
RBC: 4.63 MIL/uL (ref 3.87–5.11)
RDW: 11.8 % (ref 11.5–15.5)
WBC: 6.2 10*3/uL (ref 4.0–10.5)
nRBC: 0 % (ref 0.0–0.2)

## 2021-10-27 LAB — COMPREHENSIVE METABOLIC PANEL
ALT: 9 U/L (ref 0–44)
AST: 18 U/L (ref 15–41)
Albumin: 4 g/dL (ref 3.5–5.0)
Alkaline Phosphatase: 52 U/L (ref 38–126)
Anion gap: 8 (ref 5–15)
BUN: 10 mg/dL (ref 6–20)
CO2: 27 mmol/L (ref 22–32)
Calcium: 9.3 mg/dL (ref 8.9–10.3)
Chloride: 104 mmol/L (ref 98–111)
Creatinine, Ser: 0.95 mg/dL (ref 0.44–1.00)
GFR, Estimated: >60 mL/min (ref >60)
Glucose, Bld: 92 mg/dL (ref 70–99)
Potassium: 3.8 mmol/L (ref 3.5–5.1)
Sodium: 139 mmol/L (ref 135–145)
Total Bilirubin: 0.8 mg/dL (ref 0.3–1.2)
Total Protein: 7.5 g/dL (ref 6.5–8.1)
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
## 2021-10-28 ENCOUNTER — Emergency Department
Admission: EM | Admit: 2021-10-28 | Discharge: 2021-10-28 | Disposition: A | Discharge status: Home / Self Care | Payer: Self-pay | Attending: Emergency Medicine | Admitting: Emergency Medicine

## 2021-10-28 DIAGNOSIS — G8929 Other chronic pain: Secondary | ICD-10-CM

## 2021-10-28 DIAGNOSIS — I890 Lymphedema, not elsewhere classified: Secondary | ICD-10-CM

## 2021-10-28 MED ORDER — FUROSEMIDE 20 MG PO TABS: 20.0000 mg | ORAL_TABLET | Freq: Every day | ORAL | 0 refills | Status: AC

## 2021-10-28 MED ORDER — FUROSEMIDE 20 MG PO TABS: 20.0000 mg | ORAL_TABLET | Freq: Every day | ORAL | 0 refills | Status: DC

## 2021-10-28 NOTE — ED Provider Notes (Signed)
[facility] Provider Note    Event Date/Time   First MD Initiated Contact with Patient 10/28/21 0102     (approximate)   History   Chief Complaint Leg Pain   HPI  Diana Richards is a 26 y.o. female with past medical history of seizures and lymphedema who presents to the ED complaining of leg pain.  Patient reports that she has been dealing with multiple months of pain in her left leg, but states it has gotten to a point where she is having trouble dealing with it.  She states the leg is chronically swollen due to lymphedema and the swelling has been slightly worse than usual.  She has noticed clear drainage from a spot to her left anterior lower leg as well as the dorsum of her left foot.  She denies any recent trauma to the leg and has not noticed any erythema or warmth.  She does not take any medications currently and has not recently been using any compression on the leg.  She states she does not have insurance and does not currently follow with a PCP or other provider.     Physical Exam   Triage Vital Signs: ED Triage Vitals  Enc Vitals Group     BP 10/27/21 1936 (!) 126/93     Pulse Rate 10/27/21 1936 68     Resp 10/27/21 1936 20     Temp 10/27/21 1936 98.7 F (37.1 C)     Temp Source 10/27/21 1936 Oral     SpO2 10/27/21 1936 99 %     Weight 10/27/21 1937 145 lb (65.8 kg)     Height 10/27/21 1937 5' 6.5" (1.689 m)     Head Circumference --      Peak Flow --      Pain Score 10/27/21 1937 8     Pain Loc --      Pain Edu? --      Excl. in GC? --     Most recent vital signs: Vitals:   10/27/21 2218 10/28/21 0112  BP: 130/90 139/84  Pulse: 73 69  Resp: 16 18  Temp:  98.3 F (36.8 C)  SpO2: 93% 96%    Constitutional: Alert and oriented. Eyes: Conjunctivae are normal. Head: Atraumatic. Nose: No congestion/rhinnorhea. Mouth/Throat: Mucous membranes are moist.  Cardiovascular: Normal rate, regular rhythm. Grossly normal heart sounds.  2+  radial and DP pulses bilaterally. Respiratory: Normal respiratory effort.  No retractions. Lungs CTAB. Gastrointestinal: Soft and nontender. No distention. Musculoskeletal: 3+ pitting edema to left lower leg through mid thigh, no associated tenderness, erythema, or warmth noted.  Clear serous drainage noted from left anterior lower leg as well as dorsum of left foot, no purulent drainage noted.  Right lower extremity without edema or tenderness. Neurologic:  Normal speech and language. No gross focal neurologic deficits are appreciated.    ED Results / Procedures / Treatments   Labs (all labs ordered are listed, but only abnormal results are displayed) Labs Reviewed  CBC WITH DIFFERENTIAL/PLATELET  COMPREHENSIVE METABOLIC PANEL    PROCEDURES:  Critical Care performed: No  Procedures   MEDICATIONS ORDERED IN ED: Medications - No data to display   IMPRESSION / MDM / ASSESSMENT AND PLAN / ED COURSE  I reviewed the triage vital signs and the nursing notes.  26 y.o. female with past medical history of lymphedema and seizures who presents to the ED complaining of chronic pain and swelling in her left lower extremity for multiple months.  Patient's presentation is most consistent with acute complicated illness / injury requiring diagnostic workup.  Differential diagnosis includes, but is not limited to, DVT, lymphedema, cellulitis, arterial insufficiency, venous insufficiency.  Patient well-appearing and in no acute distress, vital signs are unremarkable and she is neurovascular intact to her distal left lower extremity.  No findings to suggest arterial insufficiency or infectious process, doubt DVT given symptoms more likely due to her known lymphedema.  She does have significant edema with weeping of fluid, with the degree of edema likely contributing to her pain.  Labs are reassuring with no significant anemia, leukocytosis, electrolyte abnormality, or  AKI.  She is appropriate for discharge home with outpatient PCP follow-up, was counseled on compression stockings and elevation, will be prescribed short course of Lasix.  She was counseled to return to the ED for new or worsening symptoms, patient agrees with plan.      FINAL CLINICAL IMPRESSION(S) / ED DIAGNOSES   Final diagnoses:  Lymphedema  Chronic pain of left lower extremity     Rx / DC Orders   ED Discharge Orders          Ordered    furosemide (LASIX) 20 MG tablet  Daily,   Status:  Discontinued        10/28/21 0154    furosemide (LASIX) 20 MG tablet  Daily        10/28/21 0201             Note:  This document was prepared using Dragon voice recognition software and may include unintentional dictation errors.   Chesley Noon, MD 10/28/21 [phone]

## 2021-10-28 NOTE — ED Notes (Signed)
E-signature pad unavailable - Pt verbalized understanding of D/C information - no additional concerns at this time.  

## 2023-05-23 ENCOUNTER — Ambulatory Visit: Payer: Medicaid Other | Admitting: Family Medicine

## 2023-05-23 ENCOUNTER — Encounter: Payer: Self-pay | Admitting: Family Medicine

## 2023-05-23 DIAGNOSIS — Z113 Encounter for screening for infections with a predominantly sexual mode of transmission: Secondary | ICD-10-CM | POA: Diagnosis not present

## 2023-05-23 DIAGNOSIS — N760 Acute vaginitis: Secondary | ICD-10-CM

## 2023-05-23 LAB — WET PREP FOR TRICH, YEAST, CLUE
Trichomonas Exam: NEGATIVE
Yeast Exam: NEGATIVE

## 2023-05-23 LAB — HM HIV SCREENING LAB: HM HIV Screening: NEGATIVE

## 2023-05-23 MED ORDER — METRONIDAZOLE 500 MG PO TABS: 500.0000 mg | ORAL_TABLET | Freq: Two times a day (BID) | ORAL | Status: AC

## 2023-05-23 NOTE — Progress Notes (Signed)
 [facility] Department STI clinic [address] [place] [address] Main phone: [phone]  STI screening visit  Subjective:  Diana Richards is a 28 y.o. female being seen today for an STI screening visit. The patient reports they do have symptoms.  Patient reports that they do not desire a pregnancy in the next year.   They reported they are not interested in discussing contraception today.    Patient's last menstrual period was 04/24/2023.  Patient has the following medical conditions:  There are no active problems to display for this patient.   Chief Complaint  Patient presents with   SEXUALLY TRANSMITTED DISEASE    Pt is here for STD screening and has no symptoms    HPI HPI Patient reports to clinic for STI testing- reports discomfort in her gentialia and wants to be checked-   Does the patient using douching products? No  Last HIV test per patient/review of record was  Lab Results  Component Value Date   HMHIVSCREEN Negative - Validated 03/19/2021   No results found for: HIV   Last HEPC test per patient/review of record was No results found for: HMHEPCSCREEN No components found for: HEPC   Last HEPB test per patient/review of record was No components found for: HMHEPBSCREEN   Patient reports last pap was:   No results found for: DIAGPAP, HPVHIGH, ADEQPAP Lab Results  Component Value Date   SPECADGYN Comment 03/19/2021   Result Date Procedure Results Follow-ups  03/19/2021 Pap IG (Image Guided) DIAGNOSIS:: Comment (A) Test Methodology: Comment Recommendation:: Comment (A) Specimen adequacy:: Comment Clinician Provided ICD10: Comment Performed by:: Comment Electronically signed by:: Comment PAP Smear Comment: . PATHOLOGIST PROVIDED ICD10:: Comment Note:: Comment     Screening for MPX risk: Does the patient have an unexplained rash? No Is the patient MSM? No Does the patient endorse multiple sex partners  or anonymous sex partners? No Did the patient have close or sexual contact with a person diagnosed with MPX? No Has the patient traveled outside the [place]  where MPX is endemic? No Is there a high clinical suspicion for MPX-- evidenced by one of the following No  -Unlikely to be chickenpox  -Lymphadenopathy  -Rash that present in same phase of evolution on any given body part See flowsheet for further details and programmatic requirements.   Immunization history:   There is no immunization history on file for this patient.   The following portions of the patient's history were reviewed and updated as appropriate: allergies, current medications, past medical history, past social history, past surgical history and problem list.  Objective:  There were no vitals filed for this visit.  Physical Exam Vitals and nursing note reviewed. Exam conducted with a chaperone present Ilah NOVAK, RN).  Constitutional:      Appearance: Normal appearance.  HENT:     Head: Normocephalic and atraumatic.     Mouth/Throat:     Mouth: Mucous membranes are moist.     Pharynx: Oropharynx is clear. No oropharyngeal exudate or posterior oropharyngeal erythema.  Pulmonary:     Effort: Pulmonary effort is normal.  Abdominal:     General: Abdomen is flat.     Palpations: There is no mass.     Tenderness: There is no abdominal tenderness. There is no rebound.  Genitourinary:    General: Normal vulva.     Exam position: Lithotomy position.     Pubic Area: No rash or pubic lice.  Labia:        Right: No rash or lesion.        Left: No rash or lesion.      Vagina: Vaginal discharge present. No erythema, bleeding or lesions.     Cervix: No cervical motion tenderness, discharge, friability, lesion or erythema.     Uterus: Normal.      Adnexa: Right adnexa normal and left adnexa normal.     Rectum: Normal.     Comments: pH = 5  White discharge present Lymphadenopathy:     Head:     Right side of head: No  preauricular or posterior auricular adenopathy.     Left side of head: No preauricular or posterior auricular adenopathy.     Cervical: No cervical adenopathy.     Upper Body:     Right upper body: No supraclavicular, axillary or epitrochlear adenopathy.     Left upper body: No supraclavicular, axillary or epitrochlear adenopathy.     Lower Body: No right inguinal adenopathy. No left inguinal adenopathy.  Skin:    General: Skin is warm and dry.     Findings: No rash.  Neurological:     Mental Status: She is alert and oriented to person, place, and time.     Assessment and Plan:  MASSA PE is a 28 y.o. female presenting to the [facility] Department for STI screening  1. Screening for venereal disease (Primary)  - Chlamydia/Gonorrhea [place] [facility] - HIV [place] [facility] - Syphilis Serology,  Lab - WET PREP FOR TRICH, YEAST, CLUE   Patient accepted all screenings including  vaginal CT/GC and bloodwork for HIV/RPR, and wet prep. Patient meets criteria for HepB screening? No. Ordered? not applicable Patient meets criteria for HepC screening? No. Ordered? not applicable  Treat wet prep per standing order Discussed time line for [facility] Lab results and that patient will be called with positive results and encouraged patient to call if she had not heard in 2 weeks.  Counseled to return or seek care for continued or worsening symptoms Recommended repeat testing in 3 months with positive results. Recommended condom use with all sex for STI prevention.   Patient is currently using  nothing  to prevent pregnancy.    Return if symptoms worsen or fail to improve, for STI screening.  No future appointments.  Verneta Bers, [place]

## 2023-05-23 NOTE — Addendum Note (Signed)
Addended by: Lenice Llamas on: 05/23/2023 02:36 PM   Modules accepted: Orders

## 2023-06-14 ENCOUNTER — Ambulatory Visit: Payer: Medicaid Other

## 2023-06-15 ENCOUNTER — Ambulatory Visit: Payer: Medicaid Other

## 2023-08-15 ENCOUNTER — Ambulatory Visit

## 2023-08-15 VITALS — BP 130/72 | Ht 66.0 in | Wt 155.0 lb

## 2023-08-15 DIAGNOSIS — Z309 Encounter for contraceptive management, unspecified: Secondary | ICD-10-CM | POA: Diagnosis not present

## 2023-08-15 DIAGNOSIS — Z3201 Encounter for pregnancy test, result positive: Secondary | ICD-10-CM

## 2023-08-15 LAB — PREGNANCY, URINE: Preg Test, Ur: POSITIVE — AB

## 2023-08-15 MED ORDER — PRENATAL 27-0.8 MG PO TABS: 1.0000 | ORAL_TABLET | Freq: Every day | ORAL | Status: AC

## 2023-08-15 NOTE — Progress Notes (Signed)
 UPT positive. Positive preg packet given and reviewed.  Plans prenatal care at [facility].   Hx seizures and lymphedema. Per patient,last seizure "years ago" Takes Keppra  and Lamotrigine  daily. Per patient, Followed by [facility] neurologist and they are aware of pregnancy and have recommended labs to check levels of seizure meds. Patient plans to contact [facility] to schedule. Has PT weekly for lymphedema.  Consult Dr Bohdan Bush and informed of health status and history. Recommends patient to establish prenatal care ASAP as she would be considered high risk and would need additional monitoring. Cautions patient to not stop meds abruptly and to follow guidance from neurologist and prenatal provider.   RN discussed provider recommendations with patient and she is in agreement and plans to contact [facility] neurologist and [facility] prenatal clinic tomorrow.    PHQ = 5 today. Denies thoughts of harming self/others. Patient states she is in process of finding a therapist. Accepts contact card for A Galvin Jules, LCSW at [facility].   The patient was dispensed prenatal vitamins #100 today per SO Dr Bohdan Bush. I provided counseling today regarding the medication. We discussed the medication, the side effects and when to call clinic. Patient given the opportunity to ask questions. Questions answered.  Patient plans to contact medicaid caseworker to change status to pregnancy.  Kamilia Carollo, RN
